# Patient Record
Sex: Male | Born: 1995 | Race: White | Hispanic: No | Marital: Single | Smoking: Former smoker
Health system: Southern US, Community
[De-identification: ages and names within clinical notes are randomized; demographics above are authoritative.]

## PROBLEM LIST (undated history)

## (undated) DIAGNOSIS — R0602 Shortness of breath: Secondary | ICD-10-CM

## (undated) DIAGNOSIS — R059 Cough, unspecified: Secondary | ICD-10-CM

## (undated) DIAGNOSIS — R05 Cough: Secondary | ICD-10-CM

## (undated) DIAGNOSIS — B2790 Infectious mononucleosis, unspecified without complication: Secondary | ICD-10-CM

## (undated) HISTORY — DX: Shortness of breath: R06.02

## (undated) HISTORY — DX: Infectious mononucleosis, unspecified without complication: B27.90

## (undated) HISTORY — DX: Cough: R05

## (undated) HISTORY — DX: Cough, unspecified: R05.9

## (undated) HISTORY — PX: OTHER SURGICAL HISTORY: SHX169

---

## 2018-04-07 ENCOUNTER — Ambulatory Visit
Admission: RE | Admit: 2018-04-07 | Discharge: 2018-04-07 | Disposition: A | Discharge status: Home / Self Care | Payer: Managed Care, Other (non HMO) | Source: Ambulatory Visit | Attending: Family Medicine | Admitting: Family Medicine

## 2018-04-07 ENCOUNTER — Other Ambulatory Visit: Payer: Self-pay | Admitting: Family Medicine

## 2018-04-07 DIAGNOSIS — R0602 Shortness of breath: Secondary | ICD-10-CM | POA: Diagnosis not present

## 2018-04-10 ENCOUNTER — Emergency Department: Payer: Managed Care, Other (non HMO)

## 2018-04-10 ENCOUNTER — Other Ambulatory Visit: Payer: Self-pay

## 2018-04-10 ENCOUNTER — Emergency Department
Admission: EM | Admit: 2018-04-10 | Discharge: 2018-04-10 | Disposition: A | Discharge status: Home / Self Care | Payer: Managed Care, Other (non HMO) | Attending: Emergency Medicine | Admitting: Emergency Medicine

## 2018-04-10 DIAGNOSIS — R091 Pleurisy: Secondary | ICD-10-CM | POA: Insufficient documentation

## 2018-04-10 DIAGNOSIS — R079 Chest pain, unspecified: Secondary | ICD-10-CM | POA: Diagnosis present

## 2018-04-10 LAB — COMPREHENSIVE METABOLIC PANEL
ALK PHOS: 48 U/L (ref 38–126)
ALT: 14 U/L (ref 0–44)
AST: 20 U/L (ref 15–41)
Albumin: 5.3 g/dL — ABNORMAL HIGH (ref 3.5–5.0)
Anion gap: 10 (ref 5–15)
BUN: 18 mg/dL (ref 6–20)
CALCIUM: 9.4 mg/dL (ref 8.9–10.3)
CHLORIDE: 104 mmol/L (ref 98–111)
CO2: 27 mmol/L (ref 22–32)
Creatinine, Ser: 1.04 mg/dL (ref 0.61–1.24)
GFR calc Af Amer: >60 mL/min (ref >60)
GFR calc non Af Amer: >60 mL/min (ref >60)
GLUCOSE: 113 mg/dL — AB (ref 70–99)
Potassium: 3.2 mmol/L — ABNORMAL LOW (ref 3.5–5.1)
Sodium: 141 mmol/L (ref 135–145)
Total Bilirubin: 1.1 mg/dL (ref 0.3–1.2)
Total Protein: 7.8 g/dL (ref 6.5–8.1)

## 2018-04-10 LAB — CBC
HCT: 45.2 % (ref 40.0–52.0)
HEMOGLOBIN: 16.4 g/dL (ref 13.0–18.0)
MCH: 33.8 pg (ref 26.0–34.0)
MCHC: 36.2 g/dL — ABNORMAL HIGH (ref 32.0–36.0)
MCV: 93.5 fL (ref 80.0–100.0)
Platelets: 200 10*3/uL (ref 150–440)
RBC: 4.84 MIL/uL (ref 4.40–5.90)
RDW: 12.7 % (ref 11.5–14.5)
WBC: 8.9 10*3/uL (ref 3.8–10.6)

## 2018-04-10 LAB — TROPONIN I: Troponin I: <0.03 ng/mL (ref <0.03)

## 2018-04-10 MED ORDER — IBUPROFEN 800 MG PO TABS
800.0000 mg | ORAL_TABLET | Freq: Once | ORAL | Status: AC
  Administered 2018-04-10: 800 mg via ORAL
  Filled 2018-04-10: qty 1

## 2018-04-10 NOTE — ED Triage Notes (Signed)
Pt in with co shob and cp x 1 week. States has been feeling fatigued with slight cough.

## 2018-04-11 NOTE — ED Provider Notes (Signed)
[facility] Emergency Department Provider Note   First MD Initiated Contact with Patient 04/10/18 0300     (approximate)  I have reviewed the triage vital signs and the nursing notes.   HISTORY  Chief Complaint Chest Pain    HPI Jeff Mejia is a 22 y.o. male presents with dyspnea and chest pain x 1 week.  Patient states that symptoms are worsened with smoking.  Patient also admits to a nonproductive cough as well.  Patient denies any fever afebrile on presentation temperature 98.4.  Patient denies any lower extremity pain or swelling.  Patient denies any known familial history of CAD or DVT PE.   Past Medical History None There are no active problems to display for this patient.     Prior to Admission medications   Not on File    Allergies No known drug allergies Social History Social History   Tobacco Use  . Smoking status: Not on file  Substance Use Topics  . Alcohol use: Not on file  . Drug use: Not on file    Review of Systems Constitutional: No fever/chills Eyes: No visual changes. ENT: No sore throat. Cardiovascular: Visited for chest pain. Respiratory: Positive for dyspnea and cough. Gastrointestinal: No abdominal pain.  No nausea, no vomiting.  No diarrhea.  No constipation. Genitourinary: Negative for dysuria. Musculoskeletal: Negative for neck pain.  Negative for back pain. Integumentary: Negative for rash. Neurological: Negative for headaches, focal weakness or numbness.   ____________________________________________   PHYSICAL EXAM:  VITAL SIGNS: ED Triage Vitals  Enc Vitals Group     BP 04/10/18 0042 (!) 147/84     Pulse Rate 04/10/18 0042 70     Resp 04/10/18 0042 18     Temp 04/10/18 0042 98.4 F (36.9 C)     Temp Source 04/10/18 0042 Oral     SpO2 04/10/18 0042 100 %     Weight 04/10/18 0043 59 kg (130 lb)     Height 04/10/18 0043 1.803 m (5\' 11" )     Head Circumference --      Peak Flow --    Pain Score 04/10/18 0043 5     Pain Loc --      Pain Edu? --      Excl. in GC? --     Constitutional: Alert and oriented. Well appearing and in no acute distress. Eyes: Conjunctivae are normal.  Head: Atraumatic. Mouth/Throat: Mucous membranes are moist. Oropharynx non-erythematous. Neck: No stridor.   Cardiovascular: Normal rate, regular rhythm. Good peripheral circulation. Grossly normal heart sounds. Respiratory: Normal respiratory effort.  No retractions. Lungs CTAB. Gastrointestinal: Soft and nontender. No distention.  Musculoskeletal: No lower extremity tenderness nor edema. No gross deformities of extremities. Neurologic:  Normal speech and language. No gross focal neurologic deficits are appreciated.  Skin:  Skin is warm, dry and intact. No rash noted. Psychiatric: Mood and affect are normal. Speech and behavior are normal.  ____________________________________________   LABS (all labs ordered are listed, but only abnormal results are displayed)  Labs Reviewed  CBC - Abnormal; Notable for the following components:      Result Value   MCHC 36.2 (*)    All other components within normal limits  COMPREHENSIVE METABOLIC PANEL - Abnormal; Notable for the following components:   Potassium 3.2 (*)    Glucose, Bld 113 (*)    Albumin 5.3 (*)    All other components within normal limits  TROPONIN I   ____________________________________________  EKG  ED ECG REPORT I, [place] N BROWN, the attending physician, personally viewed and interpreted this ECG.   Date: 04/11/2018  EKG Time: 12:46 AM  Rate: 70  Rhythm: Normal sinus rhythm  Axis: Normal  Intervals: Normal  ST&T Change: None  ____________________________________________  RADIOLOGY I, [place] N BROWN, personally viewed and evaluated these images (plain radiographs) as part of my medical decision making, as well as reviewing the written report by the radiologist.  ED MD interpretation: Chest x-ray revealed no  acute findings per radiologist.  Official radiology report(s): No results found.    Procedures   ____________________________________________   INITIAL IMPRESSION / ASSESSMENT AND PLAN / ED COURSE  As part of my medical decision making, I reviewed the following data within the electronic MEDICAL RECORD NUMBER   22 year old male presenting with above-stated history and physical exam of chest pain shortness of breath or cough.  Patient symptoms worsened with smoking.  Suspect possible pleurisy as etiology for the patient's discomfort.  Patient given ibuprofen with improvement of pain in the emergency department.  Laboratory data are normal chest x-ray likewise revealed no acute pathology. ____________________________________________  FINAL CLINICAL IMPRESSION(S) / ED DIAGNOSES  Final diagnoses:  Pleurisy     MEDICATIONS GIVEN DURING THIS VISIT:  Medications  ibuprofen (ADVIL,MOTRIN) tablet 800 mg (800 mg Oral Given 04/10/18 0447)     ED Discharge Orders    None       Note:  This document was prepared using Dragon voice recognition software and may include unintentional dictation errors.    Darci Current, MD 04/11/18 [id number]

## 2018-04-17 ENCOUNTER — Encounter: Payer: Self-pay | Admitting: Internal Medicine

## 2018-04-17 ENCOUNTER — Ambulatory Visit: Payer: Managed Care, Other (non HMO) | Admitting: Internal Medicine

## 2018-04-17 ENCOUNTER — Ambulatory Visit
Admission: RE | Admit: 2018-04-17 | Discharge: 2018-04-17 | Disposition: A | Discharge status: Home / Self Care | Payer: Managed Care, Other (non HMO) | Source: Ambulatory Visit | Attending: Adult Health | Admitting: Adult Health

## 2018-04-17 ENCOUNTER — Ambulatory Visit: Payer: Self-pay | Admitting: Internal Medicine

## 2018-04-17 VITALS — BP 122/76 | HR 76 | Resp 16 | Ht 71.0 in | Wt 133.0 lb

## 2018-04-17 DIAGNOSIS — R079 Chest pain, unspecified: Secondary | ICD-10-CM

## 2018-04-17 DIAGNOSIS — R05 Cough: Secondary | ICD-10-CM | POA: Diagnosis not present

## 2018-04-17 DIAGNOSIS — Z23 Encounter for immunization: Secondary | ICD-10-CM | POA: Diagnosis not present

## 2018-04-17 DIAGNOSIS — R0602 Shortness of breath: Secondary | ICD-10-CM

## 2018-04-17 DIAGNOSIS — R059 Cough, unspecified: Secondary | ICD-10-CM

## 2018-04-17 MED ORDER — IOHEXOL 350 MG/ML SOLN
75.0000 mL | Freq: Once | INTRAVENOUS | Status: AC | PRN
  Administered 2018-04-17: 75 mL via INTRAVENOUS

## 2018-04-17 NOTE — Progress Notes (Signed)
[facility] [place], [place] [address]  Pulmonary Sleep Medicine   Office Visit Note  Patient Name: Jeff Mejia DOB: 1996/06/27 MRN [id number]  Date of Service: 04/17/2018  Complaints/HPI: SOB going on for about 2 weeks. He states never had breathing issues in the past. He does smoke and states he was using E cigarette. Patient also does smoke marijuana. He since his breathing has been a problem states he stopped smoking the cigarettes. He has noted some improvement but not at baseline. He states no prior history of asthma. NO family history of asthma. He was on ibuprofen for "pleurisy". He states that he feels no real change. 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 Patient states he never had these issues when he was playing. Patient states he is not coughing up any sputum. He feels some phlegmn in his throat but nothing coming up. Patient states no headaches no dizziness. He does have some feeling of weakness. He is not sure if anything else was in the marijuana. He states that he does drink. No hemoptysis noted he does live in [place] and travels back and forth. Last trip was in August. Flies or drives.   ROS  General: (-) fever, (-) chills, (-) night sweats, (-) weakness Skin: (-) rashes, (-) itching,. Eyes: (-) visual changes, (-) redness, (-) itching. Nose and Sinuses: (-) nasal stuffiness or itchiness, (-) postnasal drip, (-) nosebleeds, (-) sinus trouble. Mouth and Throat: (-) sore throat, (-) hoarseness. Neck: (-) swollen glands, (-) enlarged thyroid, (-) neck pain. Respiratory: + cough, (-) bloody sputum, + shortness of breath, + wheezing. Cardiovascular: - ankle swelling, (-) chest pain. Lymphatic: (-) lymph node enlargement. Neurologic: (-) numbness, (-) tingling. Psychiatric: (-) anxiety, (-) depression   Current Medication: Outpatient Encounter Medications as of 04/17/2018  Medication Sig  . Adapalene-Benzoyl  Peroxide (EPIDUO) 0.1-2.5 % gel Apply topically at bedtime.  Marland Kitchen albuterol (PROVENTIL HFA;VENTOLIN HFA) 108 (90 Base) MCG/ACT inhaler Inhale 2 puffs into the lungs every 6 (six) hours as needed for wheezing or shortness of breath. 2 puffs every 4 to 6 hrs  Prn  . ibuprofen (ADVIL,MOTRIN) 600 MG tablet Take 600 mg by mouth every 6 (six) hours as needed.   No facility-administered encounter medications on file as of 04/17/2018.     Surgical History: Past Surgical History:  Procedure Laterality Date  . wisdom      Medical History: Past Medical History:  Diagnosis Date  . SOB (shortness of breath)     Family History: Family History  Problem Relation Age of Onset  . Skin cancer Mother   . Lung cancer Maternal Grandmother     Social History: Social History   Socioeconomic History  . Marital status: Single    Spouse name: Not on file  . Number of children: Not on file  . Years of education: Not on file  . Highest education level: Not on file  Occupational History  . Not on file  Social Needs  . Financial resource strain: Not on file  . Food insecurity:    Worry: Not on file    Inability: Not on file  . Transportation needs:    Medical: Not on file    Non-medical: Not on file  Tobacco Use  . Smoking status: Current Every Day Smoker    Types: E-cigarettes  . Smokeless tobacco: Current User    Types: Chew  Substance and Sexual Activity  . Alcohol use: Yes  .  Drug use: Yes    Types: Marijuana  . Sexual activity: Not on file  Lifestyle  . Physical activity:    Days per week: Not on file    Minutes per session: Not on file  . Stress: Not on file  Relationships  . Social connections:    Talks on phone: Not on file    Gets together: Not on file    Attends religious service: Not on file    Active member of club or organization: Not on file    Attends meetings of clubs or organizations: Not on file    Relationship status: Not on file  . Intimate partner violence:     Fear of current or ex partner: Not on file    Emotionally abused: Not on file    Physically abused: Not on file    Forced sexual activity: Not on file  Other Topics Concern  . Not on file  Social History Narrative  . Not on file    Vital Signs: Blood pressure 122/76, pulse 76, resp. rate 16, height '5\' 11"'  (1.803 m), weight 133 lb (60.3 kg), SpO2 98 %.  Examination: General Appearance: The patient is well-developed, well-nourished, and in no distress. Skin: Gross inspection of skin unremarkable. Head: normocephalic, no gross deformities. Eyes: no gross deformities noted. ENT: ears appear grossly normal no exudates. Neck: Supple. No thyromegaly. No LAD. Respiratory: scattered rhonchi noted at this time. Cardiovascular: Normal S1 and S2 without murmur or rub. Extremities: No cyanosis. pulses are equal. Neurologic: Alert and oriented. No involuntary movements.  LABS: Recent Results (from the past 2160 hour(s))  CBC     Status: Abnormal   Collection Time: 04/10/18 12:45 AM  Result Value Ref Range   WBC 8.9 3.8 - 10.6 K/uL   RBC 4.84 4.40 - 5.90 MIL/uL   Hemoglobin 16.4 13.0 - 18.0 g/dL    Comment: RESULT REPEATED AND VERIFIED   HCT 45.2 40.0 - 52.0 %    Comment: RESULT REPEATED AND VERIFIED   MCV 93.5 80.0 - 100.0 fL   MCH 33.8 26.0 - 34.0 pg   MCHC 36.2 (H) 32.0 - 36.0 g/dL   RDW 12.7 11.5 - 14.5 %   Platelets 200 150 - 440 K/uL    Comment: Performed at [facility], [address] [place], [place] [address]  Comprehensive metabolic panel     Status: Abnormal   Collection Time: 04/10/18 12:45 AM  Result Value Ref Range   Sodium 141 135 - 145 mmol/L   Potassium 3.2 (L) 3.5 - 5.1 mmol/L   Chloride 104 98 - 111 mmol/L   CO2 27 22 - 32 mmol/L   Glucose, Bld 113 (H) 70 - 99 mg/dL   BUN 18 6 - 20 mg/dL   Creatinine, Ser 1.04 0.61 - 1.24 mg/dL   Calcium 9.4 8.9 - 10.3 mg/dL   Total Protein 7.8 6.5 - 8.1 g/dL   Albumin 5.3 (H) 3.5 - 5.0 g/dL   AST 20 15 - 41 U/L    ALT 14 0 - 44 U/L   Alkaline Phosphatase 48 38 - 126 U/L   Total Bilirubin 1.1 0.3 - 1.2 mg/dL   GFR calc non Af Amer >60 >60 mL/min   GFR calc Af Amer >60 >60 mL/min    Comment: (NOTE) The eGFR has been calculated using the CKD EPI equation. This calculation has not been validated in all clinical situations. eGFR's persistently <60 mL/min signify possible Chronic Kidney Disease.  Anion gap 10 5 - 15    Comment: Performed at [facility], [address] [place], [place] [address]  Troponin I     Status: None   Collection Time: 04/10/18 12:45 AM  Result Value Ref Range   Troponin I <0.03 <0.03 ng/mL    Comment: Performed at [facility], [address] [place], [place] [address]    Radiology: Dg Chest 2 View  Result Date: 04/10/2018 CLINICAL DATA:  Chest pain EXAM: CHEST - 2 VIEW COMPARISON:  04/07/2018 chest radiograph. FINDINGS: Stable cardiomediastinal silhouette with normal heart size. No pneumothorax. No pleural effusion. Lungs appear clear, with no acute consolidative airspace disease and no pulmonary edema. IMPRESSION: No active cardiopulmonary disease. Electronically Signed   By: Ilona Sorrel M.D.   On: 04/10/2018 01:33    No results found.  Dg Chest 2 View  Result Date: 04/10/2018 CLINICAL DATA:  Chest pain EXAM: CHEST - 2 VIEW COMPARISON:  04/07/2018 chest radiograph. FINDINGS: Stable cardiomediastinal silhouette with normal heart size. No pneumothorax. No pleural effusion. Lungs appear clear, with no acute consolidative airspace disease and no pulmonary edema. IMPRESSION: No active cardiopulmonary disease. Electronically Signed   By: Ilona Sorrel M.D.   On: 04/10/2018 01:33   Dg Chest 2 View  Result Date: 04/08/2018 CLINICAL DATA:  Shortness of breath. EXAM: CHEST - 2 VIEW COMPARISON:  No prior. FINDINGS: Mediastinum hilar structures normal. Lungs are clear. No pleural effusion or pneumothorax. Heart size normal. Thoracic spine scoliosis and  degenerative change. No acute bony abnormality. Thoracic spine scoliosis. IMPRESSION: No acute cardiopulmonary disease. Electronically Signed   By: Marcello Moores  Register   On: 04/08/2018 07:28      Assessment and Plan: Patient Active Problem List   Diagnosis Date Noted  . SOB (shortness of breath)   . Cough     1. Shortness of breath patient has a strong history of e-cigarette use of my concern is that he may be developing a reaction to the electronic cigarette.  The chest x-ray did not show any acute pulmonary disease but he still has cough and some shortness of breath.  I recommended that he get a pulmonary function study done in addition a CT angiogram was ordered to get a better look at his pulmonary parenchyma make sure that he does not have any interstitial lung disease. 2. For the chest pain patient is going to get an EKG done we will review this 3. Cough may be related to acute bronchitis as above patient is going to stop all use of electronic cigarettes and will follow him up to make sure that he is showing improvement once the cigarettes have been stopped. 4. Flu vaccination was given today  General Counseling: I have discussed the findings of the evaluation and examination with Rodman Key.  I have also discussed any further diagnostic evaluation thatmay be needed or ordered today. Herman verbalizes understanding of the findings of todays visit. We also reviewed his medications today and discussed drug interactions and side effects including but not limited excessive drowsiness and altered mental states. We also discussed that there is always a risk not just to him but also people around him. he has been encouraged to call the office with any questions or concerns that should arise related to todays visit.    Time spent: 77mn  I have personally obtained a history, examined the patient, evaluated laboratory and imaging results, formulated the assessment and plan and placed orders.     Adden Strout A  Humphrey Rolls, MD [facility] Pulmonary and Critical Care Sleep medicine

## 2018-04-22 NOTE — Progress Notes (Signed)
SCANNED IN NEW PT PAPERWORK SIGNED ON 04/17/18.

## 2018-04-27 ENCOUNTER — Encounter: Payer: Self-pay | Admitting: Internal Medicine

## 2018-04-27 NOTE — Patient Instructions (Signed)
Coping with Quitting Smoking Quitting smoking is a physical and mental challenge. You will face cravings, withdrawal symptoms, and temptation. Before quitting, work with your health care provider to make a plan that can help you cope. Preparation can help you quit and keep you from giving in. How can I cope with cravings? Cravings usually last for 5-10 minutes. If you get through it, the craving will pass. Consider taking the following actions to help you cope with cravings:  Keep your mouth busy: ? Chew sugar-free gum. ? Suck on hard candies or a straw. ? Brush your teeth.  Keep your hands and body busy: ? Immediately change to a different activity when you feel a craving. ? Squeeze or play with a ball. ? Do an activity or a hobby, like making bead jewelry, practicing needlepoint, or working with wood. ? Mix up your normal routine. ? Take a short exercise break. Go for a quick walk or run up and down stairs. ? Spend time in public places where smoking is not allowed.  Focus on doing something kind or helpful for someone else.  Call a friend or family member to talk during a craving.  Join a support group.  Call a quit line, such as 1-800-QUIT-NOW.  Talk with your health care provider about medicines that might help you cope with cravings and make quitting easier for you.  How can I deal with withdrawal symptoms? Your body may experience negative effects as it tries to get used to not having nicotine in the system. These effects are called withdrawal symptoms. They may include:  Feeling hungrier than normal.  Trouble concentrating.  Irritability.  Trouble sleeping.  Feeling depressed.  Restlessness and agitation.  Craving a cigarette.  To manage withdrawal symptoms:  Avoid places, people, and activities that trigger your cravings.  Remember why you want to quit.  Get plenty of sleep.  Avoid coffee and other caffeinated drinks. These may worsen some of your  symptoms.  How can I handle social situations? Social situations can be difficult when you are quitting smoking, especially in the first few weeks. To manage this, you can:  Avoid parties, bars, and other social situations where people might be smoking.  Avoid alcohol.  Leave right away if you have the urge to smoke.  Explain to your family and friends that you are quitting smoking. Ask for understanding and support.  Plan activities with friends or family where smoking is not an option.  What are some ways I can cope with stress? Wanting to smoke may cause stress, and stress can make you want to smoke. Find ways to manage your stress. Relaxation techniques can help. For example:  Breathe slowly and deeply, in through your nose and out through your mouth.  Listen to soothing, relaxing music.  Talk with a family member or friend about your stress.  Light a candle.  Soak in a bath or take a shower.  Think about a peaceful place.  What are some ways I can prevent weight gain? Be aware that many people gain weight after they quit smoking. However, not everyone does. To keep from gaining weight, have a plan in place before you quit and stick to the plan after you quit. Your plan should include:  Having healthy snacks. When you have a craving, it may help to: ? Eat plain popcorn, crunchy carrots, celery, or other cut vegetables. ? Chew sugar-free gum.  Changing how you eat: ? Eat small portion sizes at meals. ?   Eat 4-6 small meals throughout the day instead of 1-2 large meals a day. ? Be mindful when you eat. Do not watch television or do other things that might distract you as you eat.  Exercising regularly: ? Make time to exercise each day. If you do not have time for a long workout, do short bouts of exercise for 5-10 minutes several times a day. ? Do some form of strengthening exercise, like weight lifting, and some form of aerobic exercise, like running or  swimming.  Drinking plenty of water or other low-calorie or no-calorie drinks. Drink 6-8 glasses of water daily, or as much as instructed by your health care provider.  Summary  Quitting smoking is a physical and mental challenge. You will face cravings, withdrawal symptoms, and temptation to smoke again. Preparation can help you as you go through these challenges.  You can cope with cravings by keeping your mouth busy (such as by chewing gum), keeping your body and hands busy, and making calls to family, friends, or a helpline for people who want to quit smoking.  You can cope with withdrawal symptoms by avoiding places where people smoke, avoiding drinks with caffeine, and getting plenty of rest.  Ask your health care provider about the different ways to prevent weight gain, avoid stress, and handle social situations. This information is not intended to replace advice given to you by your health care provider. Make sure you discuss any questions you have with your health care provider. Document Released: 06/29/2016 Document Revised: 06/29/2016 Document Reviewed: 06/29/2016 [employer] Interactive Patient Education  2018 [employer]  

## 2018-04-28 ENCOUNTER — Ambulatory Visit: Payer: Self-pay | Admitting: Internal Medicine

## 2018-04-30 ENCOUNTER — Ambulatory Visit: Payer: Managed Care, Other (non HMO) | Admitting: Internal Medicine

## 2018-04-30 DIAGNOSIS — R0602 Shortness of breath: Secondary | ICD-10-CM

## 2018-04-30 LAB — PULMONARY FUNCTION TEST

## 2018-05-01 ENCOUNTER — Ambulatory Visit: Payer: Self-pay | Admitting: Internal Medicine

## 2018-05-01 ENCOUNTER — Encounter: Payer: Self-pay | Admitting: Internal Medicine

## 2018-05-01 ENCOUNTER — Ambulatory Visit: Payer: Managed Care, Other (non HMO) | Admitting: Internal Medicine

## 2018-05-01 VITALS — BP 122/72 | HR 79 | Resp 16 | Ht 71.0 in | Wt 134.0 lb

## 2018-05-01 DIAGNOSIS — J40 Bronchitis, not specified as acute or chronic: Secondary | ICD-10-CM

## 2018-05-01 DIAGNOSIS — K219 Gastro-esophageal reflux disease without esophagitis: Secondary | ICD-10-CM

## 2018-05-01 DIAGNOSIS — R0602 Shortness of breath: Secondary | ICD-10-CM

## 2018-05-01 DIAGNOSIS — R05 Cough: Secondary | ICD-10-CM

## 2018-05-01 DIAGNOSIS — R059 Cough, unspecified: Secondary | ICD-10-CM

## 2018-05-01 NOTE — Progress Notes (Signed)
[facility] [place], [place] [address]  Pulmonary Sleep Medicine   Office Visit Note  Patient Name: Jeff Mejia DOB: 08/28/1995 MRN [id number]  Date of Service: 05/01/2018  Complaints/HPI: Patient is doing better he is here for follow-up.  States he is no longer smoking and is no longer using the vapor electronic cigarettes.  States he has still a little bit of cough on occasion and feels some shortness of breath.  He has noted palpitations also.  I told him that it could be related to the albuterol or the [facility].  He had pulmonary functions done which did show significant improvement compared to the spirometry he had done at the last visit.  He has had improvement from 2.8 L almost 4.6 L.  The patient I think likely had bronchospasm related to the electronic cigarettes.  The other issue he has he is having anxiety type attacks where he feels panicky.  The patient states that he does get palpitations also.  He has tried to limit all his caffeine.  ROS  General: (-) fever, (-) chills, (-) night sweats, (-) weakness Skin: (-) rashes, (-) itching,. Eyes: (-) visual changes, (-) redness, (-) itching. Nose and Sinuses: (-) nasal stuffiness or itchiness, (-) postnasal drip, (-) nosebleeds, (-) sinus trouble. Mouth and Throat: (-) sore throat, (-) hoarseness. Neck: (-) swollen glands, (-) enlarged thyroid, (-) neck pain. Respiratory: - cough, (-) bloody sputum, + shortness of breath, - wheezing. Cardiovascular: - ankle swelling, (-) chest pain. Lymphatic: (-) lymph node enlargement. Neurologic: (-) numbness, (-) tingling. Psychiatric: (-) anxiety, (-) depression   Current Medication: Outpatient Encounter Medications as of 05/01/2018  Medication Sig  . Adapalene-Benzoyl Peroxide (EPIDUO) 0.1-2.5 % gel Apply topically at bedtime.  Marland Kitchen albuterol (PROVENTIL HFA;VENTOLIN HFA) 108 (90 Base) MCG/ACT inhaler Inhale 2 puffs into the lungs every 6 (six) hours as needed  for wheezing or shortness of breath. 2 puffs every 4 to 6 hrs  Prn  . ibuprofen (ADVIL,MOTRIN) 600 MG tablet Take 600 mg by mouth every 6 (six) hours as needed.   No facility-administered encounter medications on file as of 05/01/2018.     Surgical History: Past Surgical History:  Procedure Laterality Date  . wisdom      Medical History: Past Medical History:  Diagnosis Date  . Cough   . SOB (shortness of breath)   . SOB (shortness of breath)     Family History: Family History  Problem Relation Age of Onset  . Skin cancer Mother   . Lung cancer Maternal Grandmother     Social History: Social History   Socioeconomic History  . Marital status: Single    Spouse name: Not on file  . Number of children: Not on file  . Years of education: Not on file  . Highest education level: Not on file  Occupational History  . Not on file  Social Needs  . Financial resource strain: Not on file  . Food insecurity:    Worry: Not on file    Inability: Not on file  . Transportation needs:    Medical: Not on file    Non-medical: Not on file  Tobacco Use  . Smoking status: Former Smoker    Types: E-cigarettes  . Smokeless tobacco: Current User    Types: Chew  Substance and Sexual Activity  . Alcohol use: Yes  . Drug use: Yes    Types: Marijuana  . Sexual activity: Not on file  Lifestyle  .  Physical activity:    Days per week: Not on file    Minutes per session: Not on file  . Stress: Not on file  Relationships  . Social connections:    Talks on phone: Not on file    Gets together: Not on file    Attends religious service: Not on file    Active member of club or organization: Not on file    Attends meetings of clubs or organizations: Not on file    Relationship status: Not on file  . Intimate partner violence:    Fear of current or ex partner: Not on file    Emotionally abused: Not on file    Physically abused: Not on file    Forced sexual activity: Not on file  Other  Topics Concern  . Not on file  Social History Narrative  . Not on file    Vital Signs: Blood pressure 122/72, pulse 79, resp. rate 16, height '5\' 11"'  (1.803 m), weight 134 lb (60.8 kg), SpO2 99 %.  Examination: General Appearance: The patient is well-developed, well-nourished, and in no distress. Skin: Gross inspection of skin unremarkable. Head: normocephalic, no gross deformities. Eyes: no gross deformities noted. ENT: ears appear grossly normal no exudates. Neck: Supple. No thyromegaly. No LAD. Respiratory: no rhonchi. Cardiovascular: Normal S1 and S2 without murmur or rub. Extremities: No cyanosis. pulses are equal. Neurologic: Alert and oriented. No involuntary movements.  LABS: Recent Results (from the past 2160 hour(s))  CBC     Status: Abnormal   Collection Time: 04/10/18 12:45 AM  Result Value Ref Range   WBC 8.9 3.8 - 10.6 K/uL   RBC 4.84 4.40 - 5.90 MIL/uL   Hemoglobin 16.4 13.0 - 18.0 g/dL    Comment: RESULT REPEATED AND VERIFIED   HCT 45.2 40.0 - 52.0 %    Comment: RESULT REPEATED AND VERIFIED   MCV 93.5 80.0 - 100.0 fL   MCH 33.8 26.0 - 34.0 pg   MCHC 36.2 (H) 32.0 - 36.0 g/dL   RDW 12.7 11.5 - 14.5 %   Platelets 200 150 - 440 K/uL    Comment: Performed at [facility], [address] [place], [place] [address]  Comprehensive metabolic panel     Status: Abnormal   Collection Time: 04/10/18 12:45 AM  Result Value Ref Range   Sodium 141 135 - 145 mmol/L   Potassium 3.2 (L) 3.5 - 5.1 mmol/L   Chloride 104 98 - 111 mmol/L   CO2 27 22 - 32 mmol/L   Glucose, Bld 113 (H) 70 - 99 mg/dL   BUN 18 6 - 20 mg/dL   Creatinine, Ser 1.04 0.61 - 1.24 mg/dL   Calcium 9.4 8.9 - 10.3 mg/dL   Total Protein 7.8 6.5 - 8.1 g/dL   Albumin 5.3 (H) 3.5 - 5.0 g/dL   AST 20 15 - 41 U/L   ALT 14 0 - 44 U/L   Alkaline Phosphatase 48 38 - 126 U/L   Total Bilirubin 1.1 0.3 - 1.2 mg/dL   GFR calc non Af Amer >60 >60 mL/min   GFR calc Af Amer >60 >60 mL/min    Comment:  (NOTE) The eGFR has been calculated using the CKD EPI equation. This calculation has not been validated in all clinical situations. eGFR's persistently <60 mL/min signify possible Chronic Kidney Disease.    Anion gap 10 5 - 15    Comment: Performed at [facility], [address] [place], [place] [address]  Troponin I     Status: None   Collection Time: 04/10/18 12:45 AM  Result Value Ref Range   Troponin I <0.03 <0.03 ng/mL    Comment: Performed at [facility], [address] [place], [place] [address]    Radiology: Ct Angio Chest W/cm &/or Wo Cm  Result Date: 04/17/2018 CLINICAL DATA:  Chest pain and shortness of breath EXAM: CT ANGIOGRAPHY CHEST WITH CONTRAST TECHNIQUE: Multidetector CT imaging of the chest was performed using the standard protocol during bolus administration of intravenous contrast. Multiplanar CT image reconstructions and MIPs were obtained to evaluate the vascular anatomy. CONTRAST:  29m OMNIPAQUE IOHEXOL 350 MG/ML SOLN COMPARISON:  Chest radiograph April 10, 2018 FINDINGS: Cardiovascular: There is no demonstrable pulmonary embolus. There is no thoracic aortic aneurysm or dissection. The visualized great vessels appear unremarkable. There is no pericardial effusion or pericardial thickening. Mediastinum/Nodes: Thyroid appears normal. There is no appreciable thoracic adenopathy. No esophageal lesions are appreciable. Lungs/Pleura: Lungs are clear. There is no edema or consolidation. No pleural effusion or pleural thickening evident. Upper Abdomen: Visualized upper abdominal structures appear normal. Musculoskeletal: There are no blastic or lytic bone lesions. No chest wall lesions evident. Review of the MIP images confirms the above findings. IMPRESSION: 1. No demonstrable pulmonary embolus. No thoracic aortic aneurysm or dissection. 2.  Lungs clear. 3.  No evident thoracic adenopathy. Electronically Signed   By: WLowella GripIII M.D.   On:  04/17/2018 13:05    No results found.  Dg Chest 2 View  Result Date: 04/10/2018 CLINICAL DATA:  Chest pain EXAM: CHEST - 2 VIEW COMPARISON:  04/07/2018 chest radiograph. FINDINGS: Stable cardiomediastinal silhouette with normal heart size. No pneumothorax. No pleural effusion. Lungs appear clear, with no acute consolidative airspace disease and no pulmonary edema. IMPRESSION: No active cardiopulmonary disease. Electronically Signed   By: JIlona SorrelM.D.   On: 04/10/2018 01:33   Dg Chest 2 View  Result Date: 04/08/2018 CLINICAL DATA:  Shortness of breath. EXAM: CHEST - 2 VIEW COMPARISON:  No prior. FINDINGS: Mediastinum hilar structures normal. Lungs are clear. No pleural effusion or pneumothorax. Heart size normal. Thoracic spine scoliosis and degenerative change. No acute bony abnormality. Thoracic spine scoliosis. IMPRESSION: No acute cardiopulmonary disease. Electronically Signed   By: TMarcello Moores Register   On: 04/08/2018 07:28   Ct Angio Chest W/cm &/or Wo Cm  Result Date: 04/17/2018 CLINICAL DATA:  Chest pain and shortness of breath EXAM: CT ANGIOGRAPHY CHEST WITH CONTRAST TECHNIQUE: Multidetector CT imaging of the chest was performed using the standard protocol during bolus administration of intravenous contrast. Multiplanar CT image reconstructions and MIPs were obtained to evaluate the vascular anatomy. CONTRAST:  743mOMNIPAQUE IOHEXOL 350 MG/ML SOLN COMPARISON:  Chest radiograph April 10, 2018 FINDINGS: Cardiovascular: There is no demonstrable pulmonary embolus. There is no thoracic aortic aneurysm or dissection. The visualized great vessels appear unremarkable. There is no pericardial effusion or pericardial thickening. Mediastinum/Nodes: Thyroid appears normal. There is no appreciable thoracic adenopathy. No esophageal lesions are appreciable. Lungs/Pleura: Lungs are clear. There is no edema or consolidation. No pleural effusion or pleural thickening evident. Upper Abdomen: Visualized  upper abdominal structures appear normal. Musculoskeletal: There are no blastic or lytic bone lesions. No chest wall lesions evident. Review of the MIP images confirms the above findings. IMPRESSION: 1. No demonstrable pulmonary embolus. No thoracic aortic aneurysm or dissection. 2.  Lungs clear. 3.  No evident thoracic adenopathy. Electronically Signed   By: WiLowella GripII M.D.  On: 04/17/2018 13:05      Assessment and Plan: Patient Active Problem List   Diagnosis Date Noted  . SOB (shortness of breath)   . Cough     1. SOB he is improving still has some residual shortness of breath.  He is going to continue with the albuterol as needed on when I have him come back and eventually taper off the Breo.  The albuterol should be used only as needed.  In addition getting get an echocardiogram for completeness because of the persistence of his shortness of breath and these panic-like attacks.  Need to make certain that there is no underlying valvular heart disease which could be causing his palpitations. 2. Cough clinically improving no longer smoking he understands that the electronic cigarettes are dangerous and he is not going to use anything going forwards. 3. Bronchitis improved we will continue with supportive care 4. GERD he started on Prilosec after he spoke to 1 of his family members.  I suggested that he can continue using antiacid medications and recommended over-the-counter Zantac.  General Counseling: I have discussed the findings of the evaluation and examination with Rodman Key.  I have also discussed any further diagnostic evaluation thatmay be needed or ordered today. Ismaeel verbalizes understanding of the findings of todays visit. We also reviewed his medications today and discussed drug interactions and side effects including but not limited excessive drowsiness and altered mental states. We also discussed that there is always a risk not just to him but also people around him. he  has been encouraged to call the office with any questions or concerns that should arise related to todays visit.    Time spent: 73mn  I have personally obtained a history, examined the patient, evaluated laboratory and imaging results, formulated the assessment and plan and placed orders.    SAllyne Gee MD [facility] HospitalP[facility] Sleep medicine

## 2018-05-01 NOTE — Patient Instructions (Signed)
Echocardiogram An echocardiogram, or echocardiography, uses sound waves (ultrasound) to produce an image of your heart. The echocardiogram is simple, painless, obtained within a short period of time, and offers valuable information to your health care provider. The images from an echocardiogram can provide information such as:  Evidence of coronary artery disease (CAD).  Heart size.  Heart muscle function.  Heart valve function.  Aneurysm detection.  Evidence of a past heart attack.  Fluid buildup around the heart.  Heart muscle thickening.  Assess heart valve function.  Tell a health care provider about:  Any allergies you have.  All medicines you are taking, including vitamins, herbs, eye drops, creams, and over-the-counter medicines.  Any problems you or family members have had with anesthetic medicines.  Any blood disorders you have.  Any surgeries you have had.  Any medical conditions you have.  Whether you are pregnant or may be pregnant. What happens before the procedure? No special preparation is needed. Eat and drink normally. What happens during the procedure?  In order to produce an image of your heart, gel will be applied to your chest and a wand-like tool (transducer) will be moved over your chest. The gel will help transmit the sound waves from the transducer. The sound waves will harmlessly bounce off your heart to allow the heart images to be captured in real-time motion. These images will then be recorded.  You may need an IV to receive a medicine that improves the quality of the pictures. What happens after the procedure? You may return to your normal schedule including diet, activities, and medicines, unless your health care provider tells you otherwise. This information is not intended to replace advice given to you by your health care provider. Make sure you discuss any questions you have with your health care provider. Document Released: 06/29/2000  Document Revised: 02/18/2016 Document Reviewed: 03/09/2013 Elsevier Interactive Patient Education  2017 [employer]  

## 2018-05-01 NOTE — Procedures (Signed)
[facility] [address] [place], [address]  DATE OF SERVICE: April 30, 2018  Complete Pulmonary Function Testing Interpretation:  FINDINGS:  The forced vital capacity is normal the FEV1 is 4.63 L which is within normal limits.  The FEV1 FVC ratio is normal.  Postbronchodilator there is no significant change in the FEV1.  Total lung capacity is normal residual volume is increased residual volume total lung capacity ratio is increased the FRC is increased.  The DLCO is increased.  IMPRESSION:  This pulmonary function study is within normal limits.  The patient's spirometry from the last visit was reviewed and there has been a significant improvement in his spirometry compared to the initial visit.  Yevonne Pax, MD [facility] Pulmonary Critical Care Medicine Sleep Medicine

## 2018-05-05 ENCOUNTER — Telehealth: Payer: Self-pay

## 2018-05-05 ENCOUNTER — Ambulatory Visit: Payer: Self-pay | Admitting: Internal Medicine

## 2018-05-05 NOTE — Telephone Encounter (Signed)
Pt advised take OTC Pepcid or Famotidine

## 2018-05-05 NOTE — Telephone Encounter (Signed)
Pt can use famotidine, or pepcid OTC.

## 2018-05-28 ENCOUNTER — Other Ambulatory Visit: Payer: Self-pay

## 2018-05-28 MED ORDER — ALBUTEROL SULFATE HFA 108 (90 BASE) MCG/ACT IN AERS: 2.0000 | INHALATION_SPRAY | Freq: Four times a day (QID) | RESPIRATORY_TRACT | 3 refills | Status: AC | PRN

## 2018-05-30 ENCOUNTER — Ambulatory Visit: Payer: Managed Care, Other (non HMO)

## 2018-05-30 DIAGNOSIS — R0602 Shortness of breath: Secondary | ICD-10-CM | POA: Diagnosis not present

## 2018-06-05 ENCOUNTER — Ambulatory Visit: Payer: Managed Care, Other (non HMO) | Admitting: Internal Medicine

## 2018-06-05 ENCOUNTER — Encounter: Payer: Self-pay | Admitting: Internal Medicine

## 2018-06-05 VITALS — BP 112/58 | HR 89 | Resp 16 | Ht 71.0 in | Wt 142.0 lb

## 2018-06-05 DIAGNOSIS — R059 Cough, unspecified: Secondary | ICD-10-CM

## 2018-06-05 DIAGNOSIS — R079 Chest pain, unspecified: Secondary | ICD-10-CM | POA: Diagnosis not present

## 2018-06-05 DIAGNOSIS — R0602 Shortness of breath: Secondary | ICD-10-CM | POA: Diagnosis not present

## 2018-06-05 DIAGNOSIS — R05 Cough: Secondary | ICD-10-CM | POA: Diagnosis not present

## 2018-06-05 NOTE — Progress Notes (Signed)
[facility] [place], [place] [address]  Pulmonary Sleep Medicine   Office Visit Note  Patient Name: Jeff Mejia DOB: 1996-06-21 MRN [id number]  Date of Service: 06/05/2018  Complaints/HPI: Pt here for follow up on Echo results.  His echo showed normal LVEF as well as normal diastolic function.  Patient was relieved to have normal echo results.  He states that overall he has been doing well he has continue to use his albuterol inhaler once a day.  We discussed that his inhaler should only used as needed and that excessive amounts of albuterol possibly cause tachycardia and a jittery anxious feeling.  Patient reports he still feels short of breath at times however he describes it more as a panic or anxiety feeling at this point.  He continues to report that he has stopped smoking cigarettes or vaping.  He does however report that he continues to smoke marijuana intermittently.  He reports after smoking marijuana his breathing and chest tightness feels better than it ever does.  ROS  General: (-) fever, (-) chills, (-) night sweats, (-) weakness Skin: (-) rashes, (-) itching,. Eyes: (-) visual changes, (-) redness, (-) itching. Nose and Sinuses: (-) nasal stuffiness or itchiness, (-) postnasal drip, (-) nosebleeds, (-) sinus trouble. Mouth and Throat: (-) sore throat, (-) hoarseness. Neck: (-) swollen glands, (-) enlarged thyroid, (-) neck pain. Respiratory: - cough, (-) bloody sputum, - shortness of breath, - wheezing. Cardiovascular: - ankle swelling, (-) chest pain. Lymphatic: (-) lymph node enlargement. Neurologic: (-) numbness, (-) tingling. Psychiatric: (-) anxiety, (-) depression   Current Medication: Outpatient Encounter Medications as of 06/05/2018  Medication Sig  . Adapalene-Benzoyl Peroxide (EPIDUO) 0.1-2.5 % gel Apply topically at bedtime.  Marland Kitchen albuterol (PROVENTIL HFA;VENTOLIN HFA) 108 (90 Base) MCG/ACT inhaler Inhale 2 puffs into the lungs  every 6 (six) hours as needed for wheezing or shortness of breath. 2 puffs every 4 to 6 hrs  Prn  . ibuprofen (ADVIL,MOTRIN) 600 MG tablet Take 600 mg by mouth every 6 (six) hours as needed.   No facility-administered encounter medications on file as of 06/05/2018.     Surgical History: Past Surgical History:  Procedure Laterality Date  . wisdom      Medical History: Past Medical History:  Diagnosis Date  . Cough   . Mononucleosis   . SOB (shortness of breath)   . SOB (shortness of breath)     Family History: Family History  Problem Relation Age of Onset  . Skin cancer Mother   . Lung cancer Maternal Grandmother     Social History: Social History   Socioeconomic History  . Marital status: Single    Spouse name: Not on file  . Number of children: Not on file  . Years of education: Not on file  . Highest education level: Not on file  Occupational History  . Not on file  Social Needs  . Financial resource strain: Not on file  . Food insecurity:    Worry: Not on file    Inability: Not on file  . Transportation needs:    Medical: Not on file    Non-medical: Not on file  Tobacco Use  . Smoking status: Former Smoker    Types: E-cigarettes  . Smokeless tobacco: Former Systems developer    Types: Chew  Substance and Sexual Activity  . Alcohol use: Yes  . Drug use: Yes    Types: Marijuana  . Sexual activity: Not on file  Lifestyle  .  Physical activity:    Days per week: Not on file    Minutes per session: Not on file  . Stress: Not on file  Relationships  . Social connections:    Talks on phone: Not on file    Gets together: Not on file    Attends religious service: Not on file    Active member of club or organization: Not on file    Attends meetings of clubs or organizations: Not on file    Relationship status: Not on file  . Intimate partner violence:    Fear of current or ex partner: Not on file    Emotionally abused: Not on file    Physically abused: Not on file     Forced sexual activity: Not on file  Other Topics Concern  . Not on file  Social History Narrative  . Not on file    Vital Signs: Blood pressure (!) 112/58, pulse 89, resp. rate 16, height _0  (1.803 m), weight 142 lb (64.4 kg), SpO2 99 %.  Examination: General Appearance: The patient is well-developed, well-nourished, and in no distress. Skin: Gross inspection of skin unremarkable. Head: normocephalic, no gross deformities. Eyes: no gross deformities noted. ENT: ears appear grossly normal no exudates. Neck: Supple. No thyromegaly. No LAD. Respiratory: clear to auscultation. Cardiovascular: Normal S1 and S2 without murmur or rub. Extremities: No cyanosis. pulses are equal. Neurologic: Alert and oriented. No involuntary movements.  LABS: Recent Results (from the past 2160 hour(s))  CBC     Status: Abnormal   Collection Time: 04/10/18 12:45 AM  Result Value Ref Range   WBC 8.9 3.8 - 10.6 K/uL   RBC 4.84 4.40 - 5.90 MIL/uL   Hemoglobin 16.4 13.0 - 18.0 g/dL    Comment: RESULT REPEATED AND VERIFIED   HCT 45.2 40.0 - 52.0 %    Comment: RESULT REPEATED AND VERIFIED   MCV 93.5 80.0 - 100.0 fL   MCH 33.8 26.0 - 34.0 pg   MCHC 36.2 (H) 32.0 - 36.0 g/dL   RDW 12.7 11.5 - 14.5 %   Platelets 200 150 - 440 K/uL    Comment: Performed at [facility], [address] [place],  [address]  Comprehensive metabolic panel     Status: Abnormal   Collection Time: 04/10/18 12:45 AM  Result Value Ref Range   Sodium 141 135 - 145 mmol/L   Potassium 3.2 (L) 3.5 - 5.1 mmol/L   Chloride 104 98 - 111 mmol/L   CO2 27 22 - 32 mmol/L   Glucose, Bld 113 (H) 70 - 99 mg/dL   BUN 18 6 - 20 mg/dL   Creatinine, Ser 1.04 0.61 - 1.24 mg/dL   Calcium 9.4 8.9 - 10.3 mg/dL   Total Protein 7.8 6.5 - 8.1 g/dL   Albumin 5.3 (H) 3.5 - 5.0 g/dL   AST 20 15 - 41 U/L   ALT 14 0 - 44 U/L   Alkaline Phosphatase 48 38 - 126 U/L   Total Bilirubin 1.1 0.3 - 1.2 mg/dL   GFR calc non Af Amer  >60 >60 mL/min   GFR calc Af Amer >60 >60 mL/min    Comment: (NOTE) The eGFR has been calculated using the CKD EPI equation. This calculation has not been validated in all clinical situations. eGFR's persistently <60 mL/min signify possible Chronic Kidney Disease.    Anion gap 10 5 - 15    Comment: Performed at [facility], [place], [place]  [address]  Troponin I     Status: None   Collection Time: 04/10/18 12:45 AM  Result Value Ref Range   Troponin I <0.03 <0.03 ng/mL    Comment: Performed at [facility], [address] [place], [place] [address]  Pulmonary function test     Status: None   Collection Time: 04/30/18  1:30 PM  Result Value Ref Range   FEV1     FVC     FEV1/FVC     TLC     DLCO      Radiology: Ct Angio Chest W/cm &/or Wo Cm  Result Date: 04/17/2018 CLINICAL DATA:  Chest pain and shortness of breath EXAM: CT ANGIOGRAPHY CHEST WITH CONTRAST TECHNIQUE: Multidetector CT imaging of the chest was performed using the standard protocol during bolus administration of intravenous contrast. Multiplanar CT image reconstructions and MIPs were obtained to evaluate the vascular anatomy. CONTRAST:  55m OMNIPAQUE IOHEXOL 350 MG/ML SOLN COMPARISON:  Chest radiograph April 10, 2018 FINDINGS: Cardiovascular: There is no demonstrable pulmonary embolus. There is no thoracic aortic aneurysm or dissection. The visualized great vessels appear unremarkable. There is no pericardial effusion or pericardial thickening. Mediastinum/Nodes: Thyroid appears normal. There is no appreciable thoracic adenopathy. No esophageal lesions are appreciable. Lungs/Pleura: Lungs are clear. There is no edema or consolidation. No pleural effusion or pleural thickening evident. Upper Abdomen: Visualized upper abdominal structures appear normal. Musculoskeletal: There are no blastic or lytic bone lesions. No chest wall lesions evident. Review of the MIP images confirms the above  findings. IMPRESSION: 1. No demonstrable pulmonary embolus. No thoracic aortic aneurysm or dissection. 2.  Lungs clear. 3.  No evident thoracic adenopathy. Electronically Signed   By: WLowella GripIII M.D.   On: 04/17/2018 13:05    No results found.  No results found.    Assessment and Plan: Patient Active Problem List   Diagnosis Date Noted  . SOB (shortness of breath)   . Cough    1. SOB (shortness of breath) Continue using albuterol inhaler as prescribed.  2. Cough Appears to be resolving.  Patient not complaining of excessive coughing at this visit.  Encouraged him to continue using his inhaler.  3. Chest pain, unspecified type This chest pain/pressure that he feels intermittently likely due to anxiety and stress.  He reports that it most often happens in class or when he is working on schoolwork.  I encouraged him to follow-up with his primary care physician to discuss the possibility of evaluation for anxiety.   General Counseling: I have discussed the findings of the evaluation and examination with MRodman Key  I have also discussed any further diagnostic evaluation thatmay be needed or ordered today. MLynnverbalizes understanding of the findings of todays visit. We also reviewed his medications today and discussed drug interactions and side effects including but not limited excessive drowsiness and altered mental states. We also discussed that there is always a risk not just to him but also people around him. he has been encouraged to call the office with any questions or concerns that should arise related to todays visit.    Time spent: 25 This patient was seen by AOrson GearAGNP-C in Collaboration with Dr. SDevona Konigas a part of collaborative care agreement.    I have personally obtained a history, examined the patient, evaluated laboratory and imaging results, formulated the assessment and plan and placed orders.    SAllyne Gee MD F[facility]enterPulmonary and  Critical Care Sleep medicine

## 2018-09-22 ENCOUNTER — Emergency Department
Admission: EM | Admit: 2018-09-22 | Discharge: 2018-09-22 | Disposition: A | Discharge status: Home / Self Care | Payer: Managed Care, Other (non HMO) | Attending: Emergency Medicine | Admitting: Emergency Medicine

## 2018-09-22 ENCOUNTER — Encounter: Payer: Self-pay | Admitting: Emergency Medicine

## 2018-09-22 ENCOUNTER — Other Ambulatory Visit: Payer: Self-pay

## 2018-09-22 ENCOUNTER — Emergency Department: Payer: Managed Care, Other (non HMO)

## 2018-09-22 DIAGNOSIS — K2900 Acute gastritis without bleeding: Secondary | ICD-10-CM

## 2018-09-22 DIAGNOSIS — Z87891 Personal history of nicotine dependence: Secondary | ICD-10-CM | POA: Diagnosis not present

## 2018-09-22 DIAGNOSIS — R1012 Left upper quadrant pain: Secondary | ICD-10-CM | POA: Diagnosis present

## 2018-09-22 LAB — URINALYSIS, COMPLETE (UACMP) WITH MICROSCOPIC
Bacteria, UA: NONE SEEN
Bilirubin Urine: NEGATIVE
GLUCOSE, UA: NEGATIVE mg/dL
Ketones, ur: 20 mg/dL — AB
Leukocytes,Ua: NEGATIVE
Nitrite: NEGATIVE
PH: 5 (ref 5.0–8.0)
Protein, ur: NEGATIVE mg/dL
SPECIFIC GRAVITY, URINE: 1.018 (ref 1.005–1.030)

## 2018-09-22 LAB — LIPASE, BLOOD: Lipase: 30 U/L (ref 11–51)

## 2018-09-22 LAB — COMPREHENSIVE METABOLIC PANEL
ALBUMIN: 5.4 g/dL — AB (ref 3.5–5.0)
ALK PHOS: 58 U/L (ref 38–126)
ALT: 14 U/L (ref 0–44)
ANION GAP: 12 (ref 5–15)
AST: 20 U/L (ref 15–41)
BILIRUBIN TOTAL: 1.9 mg/dL — AB (ref 0.3–1.2)
BUN: 19 mg/dL (ref 6–20)
CO2: 22 mmol/L (ref 22–32)
Calcium: 9.9 mg/dL (ref 8.9–10.3)
Chloride: 102 mmol/L (ref 98–111)
Creatinine, Ser: 1.05 mg/dL (ref 0.61–1.24)
GFR calc Af Amer: >60 mL/min (ref >60)
GFR calc non Af Amer: >60 mL/min (ref >60)
GLUCOSE: 118 mg/dL — AB (ref 70–99)
Potassium: 3.8 mmol/L (ref 3.5–5.1)
Sodium: 136 mmol/L (ref 135–145)
TOTAL PROTEIN: 8.2 g/dL — AB (ref 6.5–8.1)

## 2018-09-22 LAB — CBC
HCT: 50.2 % (ref 39.0–52.0)
Hemoglobin: 17.9 g/dL — ABNORMAL HIGH (ref 13.0–17.0)
MCH: 31.8 pg (ref 26.0–34.0)
MCHC: 35.7 g/dL (ref 30.0–36.0)
MCV: 89.2 fL (ref 80.0–100.0)
Platelets: 234 10*3/uL (ref 150–400)
RBC: 5.63 MIL/uL (ref 4.22–5.81)
RDW: 11.2 % — AB (ref 11.5–15.5)
WBC: 6 10*3/uL (ref 4.0–10.5)
nRBC: 0 % (ref 0.0–0.2)

## 2018-09-22 MED ORDER — IOHEXOL 300 MG/ML  SOLN
100.0000 mL | Freq: Once | INTRAMUSCULAR | Status: AC | PRN
  Administered 2018-09-22: 100 mL via INTRAVENOUS
  Filled 2018-09-22: qty 100

## 2018-09-22 MED ORDER — SUCRALFATE 1 G PO TABS: 1.0000 g | ORAL_TABLET | Freq: Four times a day (QID) | ORAL | 1 refills | Status: AC

## 2018-09-22 MED ORDER — SODIUM CHLORIDE 0.9% FLUSH
3.0000 mL | Freq: Once | INTRAVENOUS | Status: AC
  Administered 2018-09-22: 3 mL via INTRAVENOUS

## 2018-09-22 MED ORDER — IOPAMIDOL (ISOVUE-300) INJECTION 61%
30.0000 mL | Freq: Once | INTRAVENOUS | Status: AC | PRN
  Administered 2018-09-22: 30 mL via ORAL
  Filled 2018-09-22: qty 30

## 2018-09-22 NOTE — ED Provider Notes (Signed)
[facility] Emergency Department Provider Note   ____________________________________________    I have reviewed the triage vital signs and the nursing notes.   HISTORY  Chief Complaint Abdominal Pain     HPI Jeff Mejia is a 23 y.o. male who presents with complaints of abdominal pain.  Patient describes 3 days of waxing and waning left upper quadrant abdominal pain which she reports at times is severe.  Significant nausea, no vomiting.  No fevers or chills.  No recent upper respiratory function, diagnosed with mono in the fall.  No recent travel.  Has not take anything for this.  Has never had this before.  Past Medical History:  Diagnosis Date  . Cough   . Mononucleosis   . SOB (shortness of breath)   . SOB (shortness of breath)     Patient Active Problem List   Diagnosis Date Noted  . SOB (shortness of breath)   . Cough     Past Surgical History:  Procedure Laterality Date  . wisdom      Prior to Admission medications   Medication Sig Start Date End Date Taking? Authorizing Provider  Adapalene-Benzoyl Peroxide (EPIDUO) 0.1-2.5 % gel Apply topically at bedtime.    [provider]  albuterol (PROVENTIL HFA;VENTOLIN HFA) 108 (90 Base) MCG/ACT inhaler Inhale 2 puffs into the lungs every 6 (six) hours as needed for wheezing or shortness of breath. 2 puffs every 4 to 6 hrs  Prn 05/28/18   Johnna Acosta, NP  ibuprofen (ADVIL,MOTRIN) 600 MG tablet Take 600 mg by mouth every 6 (six) hours as needed.    [provider]  sucralfate (CARAFATE) 1 g tablet Take 1 tablet (1 g total) by mouth 4 (four) times daily for 20 days. 09/22/18 10/12/18  Jene Every, MD     Allergies Patient has no known allergies.  Family History  Problem Relation Age of Onset  . Skin cancer Mother   . Lung cancer Maternal Grandmother     Social History Social History   Tobacco Use  . Smoking status: Former Smoker    Types: E-cigarettes  .  Smokeless tobacco: Former Neurosurgeon    Types: Chew  Substance Use Topics  . Alcohol use: Yes  . Drug use: Yes    Types: Marijuana    Review of Systems  Constitutional: No fever/chills Eyes: No visual changes.  ENT: No sore throat. Cardiovascular: Denies chest pain. Respiratory: Denies shortness of breath. Gastrointestinal: As above Genitourinary: Negative for dysuria. Musculoskeletal: Negative for back pain. Skin: Negative for rash. Neurological: Negative for headaches or weakness   ____________________________________________   PHYSICAL EXAM:  VITAL SIGNS: ED Triage Vitals  Enc Vitals Group     BP 09/22/18 1105 (!) 136/55     Pulse Rate 09/22/18 1105 98     Resp 09/22/18 1105 16     Temp 09/22/18 1105 98.5 F (36.9 C)     Temp Source 09/22/18 1105 Oral     SpO2 09/22/18 1105 98 %     Weight 09/22/18 1104 63.5 kg (140 lb)     Height 09/22/18 1104 1.803 m (5\' 11" )     Head Circumference --      Peak Flow --      Pain Score 09/22/18 1104 6     Pain Loc --      Pain Edu? --      Excl. in GC? --     Constitutional: Alert and oriented. No acute distress.  Pleasant and interactive  Mouth/Throat: Mucous membranes are moist.   Neck:  Painless ROM Cardiovascular: Normal rate, regular rhythm. Grossly normal heart sounds.  Good peripheral circulation. Respiratory: Normal respiratory effort.  No retractions. Lungs CTAB. Gastrointestinal: Soft, no distention, no CVA tenderness, mild tenderness palpation left upper quadrant  Musculoskeletal: .  Warm and well perfused Neurologic:  Normal speech and language. No gross focal neurologic deficits are appreciated.  Skin:  Skin is warm, dry and intact. No rash noted. Psychiatric: Mood and affect are normal. Speech and behavior are normal.  ____________________________________________   LABS (all labs ordered are listed, but only abnormal results are displayed)  Labs Reviewed  COMPREHENSIVE METABOLIC PANEL - Abnormal; Notable  for the following components:      Result Value   Glucose, Bld 118 (*)    Total Protein 8.2 (*)    Albumin 5.4 (*)    Total Bilirubin 1.9 (*)    All other components within normal limits  CBC - Abnormal; Notable for the following components:   Hemoglobin 17.9 (*)    RDW 11.2 (*)    All other components within normal limits  URINALYSIS, COMPLETE (UACMP) WITH MICROSCOPIC - Abnormal; Notable for the following components:   Color, Urine YELLOW (*)    APPearance CLEAR (*)    Hgb urine dipstick SMALL (*)    Ketones, ur 20 (*)    All other components within normal limits  LIPASE, BLOOD   ____________________________________________  EKG  None ____________________________________________  RADIOLOGY  CT abdomen pelvis ____________________________________________   PROCEDURES  Procedure(s) performed: No  Procedures   Critical Care performed: No ____________________________________________   INITIAL IMPRESSION / ASSESSMENT AND PLAN / ED COURSE  Pertinent labs & imaging results that were available during my care of the patient were reviewed by me and considered in my medical decision making (see chart for details).  Patient presents with left upper quadrant pain, differential includes gastritis, PUD, splenic enlargement possibly related to mono in the fall versus nonspecific abdominal pain  Lab work is overall quite reassuring, lipase is normal.  We will obtain imaging to evaluate further  ----------------------------------------- 2:06 PM on 09/22/2018 -----------------------------------------  CT abdomen pelvis is unremarkable, suspect gastritis, will start the patient on Carafate he is Artie taking twice daily Prilosec, outpatient follow-up with GI for further evaluation   ____________________________________________   FINAL CLINICAL IMPRESSION(S) / ED DIAGNOSES  Final diagnoses:  Acute gastritis without hemorrhage, unspecified gastritis type        Note:   This document was prepared using Dragon voice recognition software and may include unintentional dictation errors.   Jene Every, MD 09/22/18 [phone]

## 2018-09-22 NOTE — ED Triage Notes (Signed)
Pt to ED via POV c/o abdominal pain since Friday. Pt states that he was seen at urgent care on Saturday but left before being seen. Pt states that the pain is located under his left rib cage. Pt is in NAD at this time.

## 2018-09-30 ENCOUNTER — Other Ambulatory Visit: Payer: Self-pay

## 2018-09-30 ENCOUNTER — Encounter: Payer: Self-pay | Admitting: Gastroenterology

## 2018-09-30 ENCOUNTER — Ambulatory Visit: Payer: Managed Care, Other (non HMO) | Admitting: Gastroenterology

## 2018-09-30 VITALS — BP 145/75 | HR 91 | Ht 71.0 in | Wt 134.8 lb

## 2018-09-30 DIAGNOSIS — R1013 Epigastric pain: Secondary | ICD-10-CM

## 2018-09-30 DIAGNOSIS — R197 Diarrhea, unspecified: Secondary | ICD-10-CM

## 2018-09-30 DIAGNOSIS — R748 Abnormal levels of other serum enzymes: Secondary | ICD-10-CM

## 2018-09-30 NOTE — Addendum Note (Signed)
Addended by: Jackquline Denmark on: 09/30/2018 05:03 PM   Modules accepted: Orders, SmartSet

## 2018-09-30 NOTE — Progress Notes (Signed)
Jeff Mejia [address]  [address]  [place], [place] [address]  Main: [phone]  Fax: [phone]   Gastroenterology Consultation  Referring Provider:     Daiva Eves, MD Primary Care Physician:  Daiva Eves, MD Reason for Consultation:    Abdominal discomfort        HPI:    Chief Complaint  Patient presents with  . New Patient (Initial Visit)    ED f/u acute gastritis    TORIS MILILLO is a 23 y.o. y/o male referred for consultation & management  by Dr. Daiva Eves, MD.  Patient presents with his mother for further evaluation of abdominal complaints.  Patient lives in [place] for school, and is here visiting his family.  Mother states she has noticed that the patient has been eating less, reports early satiety, and abdominal bloating immediately after eating, this has been ongoing for the last 3 to 6 months.  They went to the ER on March 9 due to abdominal pain in the left upper quadrant.  Work-up was reassuring and they were discharged with Protonix and sucralfate.  Patient states the sucralfate has been helping.  Prior to the Protonix he was on Prilosec for about 3 months which helped as well but symptoms continued on a daily basis.  No nausea or vomiting, no weight loss.  Abdominal pain is more of a discomfort than pain itself.  Denies any dysphagia, but states intermittently he feels that food goes down very slowly through his esophagus.  No episodes of food impaction.  Drinks about twice a week and states during those days he may have 3-4 drinks in 1 sitting.  Describes marijuana use but no tobacco use.  No episodes of vomiting.  Past Medical History:  Diagnosis Date  . Cough   . Mononucleosis   . SOB (shortness of breath)   . SOB (shortness of breath)     Past Surgical History:  Procedure Laterality Date  . wisdom      Prior to Admission medications   Medication Sig Start Date End Date Taking? Authorizing Provider   Adapalene-Benzoyl Peroxide (EPIDUO) 0.1-2.5 % gel Apply topically at bedtime.   Yes [provider]  albuterol (PROVENTIL HFA;VENTOLIN HFA) 108 (90 Base) MCG/ACT inhaler Inhale 2 puffs into the lungs every 6 (six) hours as needed for wheezing or shortness of breath. 2 puffs every 4 to 6 hrs  Prn 05/28/18  Yes [place], Coralee North, NP  pantoprazole (PROTONIX) 40 MG tablet  09/20/18  Yes [provider]  sucralfate (CARAFATE) 1 g tablet Take 1 tablet (1 g total) by mouth 4 (four) times daily for 20 days. 09/22/18 10/12/18 Yes Jene Every, MD  ibuprofen (ADVIL,MOTRIN) 600 MG tablet Take 600 mg by mouth every 6 (six) hours as needed.    [provider]    Family History  Problem Relation Age of Onset  . Skin cancer Mother   . Lung cancer Maternal Grandmother      Social History   Tobacco Use  . Smoking status: Former Smoker    Types: E-cigarettes  . Smokeless tobacco: Former Neurosurgeon    Types: Chew  Substance Use Topics  . Alcohol use: Yes  . Drug use: Yes    Types: Marijuana    Allergies as of 09/30/2018  . (No Known Allergies)    Review of Systems:    All systems reviewed and negative except where noted in HPI.   Physical Exam:  BP Marland Kitchen)  145/75   Pulse 91   Ht 5\' 11"  (1.803 m)   Wt 134 lb 12.8 oz (61.1 kg)   BMI 18.80 kg/m  No LMP for male patient. Psych:  Alert and cooperative. Normal mood and affect. General:   Alert,  Well-developed, well-nourished, pleasant and cooperative in NAD Head:  Normocephalic and atraumatic. Eyes:  Sclera clear, no icterus.   Conjunctiva pink. Ears:  Normal auditory acuity. Nose:  No deformity, discharge, or lesions. Mouth:  No deformity or lesions,oropharynx pink & moist. Neck:  Supple; no masses or thyromegaly. Abdomen:  Normal bowel sounds.  No bruits.  Soft, non-tender and non-distended without masses, hepatosplenomegaly or hernias noted.  No guarding or rebound tenderness.    Msk:  Symmetrical without gross deformities.  Good, equal movement & strength bilaterally. Pulses:  Normal pulses noted. Extremities:  No clubbing or edema.  No cyanosis. Neurologic:  Alert and oriented x3;  grossly normal neurologically. Skin:  Intact without significant lesions or rashes. No jaundice. Lymph Nodes:  No significant cervical adenopathy. Psych:  Alert and cooperative. Normal mood and affect.   Labs: CBC    Component Value Date/Time   WBC 6.0 09/22/2018 1110   RBC 5.63 09/22/2018 1110   HGB 17.9 (H) 09/22/2018 1110   HCT 50.2 09/22/2018 1110   PLT 234 09/22/2018 1110   MCV 89.2 09/22/2018 1110   MCH 31.8 09/22/2018 1110   MCHC 35.7 09/22/2018 1110   RDW 11.2 (L) 09/22/2018 1110   CMP     Component Value Date/Time   NA 136 09/22/2018 1110   K 3.8 09/22/2018 1110   CL 102 09/22/2018 1110   CO2 22 09/22/2018 1110   GLUCOSE 118 (H) 09/22/2018 1110   BUN 19 09/22/2018 1110   CREATININE 1.05 09/22/2018 1110   CALCIUM 9.9 09/22/2018 1110   PROT 8.2 (H) 09/22/2018 1110   ALBUMIN 5.4 (H) 09/22/2018 1110   AST 20 09/22/2018 1110   ALT 14 09/22/2018 1110   ALKPHOS 58 09/22/2018 1110   BILITOT 1.9 (H) 09/22/2018 1110   GFRNONAA >60 09/22/2018 1110   GFRAA >60 09/22/2018 1110    Imaging Studies: Ct Abdomen Pelvis W Contrast  Result Date: 09/22/2018 CLINICAL DATA:  Left upper quadrant pain for 3 days. EXAM: CT ABDOMEN AND PELVIS WITH CONTRAST TECHNIQUE: Multidetector CT imaging of the abdomen and pelvis was performed using the standard protocol following bolus administration of intravenous contrast. CONTRAST:  OMNIPAQUE IOHEXOL 300 MG/ML  SOLN COMPARISON:  None. FINDINGS: Lower chest: No acute abnormality. Hepatobiliary: No focal liver abnormality is seen. No gallstones, gallbladder wall thickening, or biliary dilatation. Pancreas: Unremarkable. No pancreatic ductal dilatation or surrounding inflammatory changes. Spleen: Normal in size without focal abnormality. Adrenals/Urinary Tract: Adrenal glands are  unremarkable. Kidneys are normal, without renal calculi, focal lesion, or hydronephrosis. Bladder is unremarkable. Stomach/Bowel: Stomach is within normal limits. Appendix appears normal. No evidence of bowel wall thickening, distention, or inflammatory changes. Vascular/Lymphatic: No significant vascular findings are present. No enlarged abdominal or pelvic lymph nodes. Reproductive: Prostate is unremarkable. Other: No abdominal wall hernia or abnormality. No abdominopelvic ascites. Musculoskeletal: No acute or significant osseous findings. IMPRESSION: 1. No cause for the patient's symptoms identified. No acute abnormalities. Electronically Signed   By: Gerome Sam III M.D   On: 09/22/2018 13:46    Assessment and Plan:   D'ARCY LEWIN is a 23 y.o. y/o male has been referred for abdominal pain   Symptoms most likely related to dyspepsia or GERD However,  patient and mother specifically state that he has been diagnosed with anxiety and attribute some of his symptoms to anxiety.  His primary care physician has recommended anxiety medications but they have not started it so far as they want to wait for GI evaluation.  Due to the change in his eating habits they are concerned He has not been using any NSAIDs recently but about 1 to 2 months ago was using ibuprofen or Aleve as needed  He denies any black stool, but has noted that he has 1-2 bowel movements a day and they are different in texture.  Denies any blood per rectum.  Due to ongoing symptoms despite PPI therapy, we discussed conservative management with H. pylori testing versus proceeding with upper endoscopy.  Patient reports previous testing for H. pylori via blood test.  I do not have these results but patient states it was negative and was done at the time he was already taking Prilosec.  They would like to proceed with upper endoscopy at this time for further evaluation given ongoing symptoms  He has no alarm symptoms or family  history to indicate colonoscopy at this time However, will order stool studies at this time as well  We discussed that if EGD is normal, given his anxiety symptoms may be functional in nature and he should follow-up with his primary care provider to discuss anxiety medications further that were discussed with him previously by his PCP.  I have discussed alternative options, risks & benefits,  which include, but are not limited to, bleeding, infection, perforation,respiratory complication & drug reaction.  The patient agrees with this plan & written consent will be obtained.    I will also repeat his liver enzymes as his bilirubin was mildly elevated during his ER visit.  If this is still elevated he will need further work-up.  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

## 2018-10-01 ENCOUNTER — Other Ambulatory Visit: Payer: Self-pay

## 2018-10-01 ENCOUNTER — Telehealth: Payer: Self-pay | Admitting: Gastroenterology

## 2018-10-01 DIAGNOSIS — R1013 Epigastric pain: Secondary | ICD-10-CM

## 2018-10-01 LAB — HEPATIC FUNCTION PANEL
ALBUMIN: 5 g/dL (ref 4.1–5.2)
ALT: 11 IU/L (ref 0–44)
AST: 16 IU/L (ref 0–40)
Alkaline Phosphatase: 64 IU/L (ref 39–117)
BILIRUBIN TOTAL: 0.7 mg/dL (ref 0.0–1.2)
Bilirubin, Direct: 0.21 mg/dL (ref 0.00–0.40)
TOTAL PROTEIN: 7.5 g/dL (ref 6.0–8.5)

## 2018-10-01 NOTE — Telephone Encounter (Signed)
Patient called & L/M ON V/M  He was seen by Dr Maximino Greenland 09-30-18 in the [facility] office. He had some lab work drawn & other specimens. He would like to know the names for his records.

## 2018-10-01 NOTE — Telephone Encounter (Signed)
This patient was called today to discuss cancellation of his procedure as the instructions we have received in the setting of coronavirus situation, is to reschedule any elective procedures at this time.  Since his work-up has been very reassuring so far including imaging and lab work, his scheduled EGD is considered an elective procedure.  I discussed with the patient his reassuring findings so far, and have recommended he continue his PPI.  We will also obtain H. pylori serology at this time.  I recommended an upper GI study but that is not available to be scheduled until April 20.  I recommended to the patient that he get this scheduled at this time and if scheduling goes back to normal before then and we are able to schedule his EGD sooner with, we can go ahead and schedule that.  Given that he admits to anxiety and depression states that a lot of her symptoms are related to that and his primary care provider discussed anxiety medication with him, I have asked him to follow-up with his PCP as well and discuss starting on those medications as well.

## 2018-10-02 ENCOUNTER — Other Ambulatory Visit: Payer: Self-pay

## 2018-10-02 DIAGNOSIS — R1013 Epigastric pain: Secondary | ICD-10-CM

## 2018-10-02 MED ORDER — PANTOPRAZOLE SODIUM 40 MG PO TBEC: 40.0000 mg | DELAYED_RELEASE_TABLET | Freq: Two times a day (BID) | ORAL | 1 refills | Status: AC

## 2018-10-03 ENCOUNTER — Encounter: Admission: RE | Payer: Self-pay | Source: Home / Self Care

## 2018-10-03 ENCOUNTER — Ambulatory Visit
Admission: RE | Admit: 2018-10-03 | Payer: Managed Care, Other (non HMO) | Source: Home / Self Care | Admitting: Gastroenterology

## 2018-10-03 SURGERY — ESOPHAGOGASTRODUODENOSCOPY (EGD) WITH PROPOFOL
Anesthesia: General

## 2018-10-04 LAB — H PYLORI, IGM, IGG, IGA AB
H pylori, IgM Abs: <9 units (ref 0.0–8.9)
H. pylori, IgA Abs: <9 units (ref 0.0–8.9)
H. pylori, IgG AbS: 0.09 Index Value (ref 0.00–0.79)

## 2018-10-06 ENCOUNTER — Telehealth: Payer: Self-pay

## 2018-10-06 LAB — GI PROFILE, STOOL, PCR
ADENOVIRUS F 40/41: NOT DETECTED
ASTROVIRUS: NOT DETECTED
C difficile toxin A/B: NOT DETECTED
CYCLOSPORA CAYETANENSIS: NOT DETECTED
Campylobacter: NOT DETECTED
Cryptosporidium: NOT DETECTED
ENTAMOEBA HISTOLYTICA: NOT DETECTED
Enteroaggregative E coli: NOT DETECTED
Enteropathogenic E coli: NOT DETECTED
Enterotoxigenic E coli: NOT DETECTED
Giardia lamblia: NOT DETECTED
Norovirus GI/GII: NOT DETECTED
Plesiomonas shigelloides: NOT DETECTED
Rotavirus A: NOT DETECTED
Salmonella: NOT DETECTED
Sapovirus: NOT DETECTED
Shiga-toxin-producing E coli: NOT DETECTED
Shigella/Enteroinvasive E coli: NOT DETECTED
VIBRIO CHOLERAE: NOT DETECTED
VIBRIO: NOT DETECTED
Yersinia enterocolitica: NOT DETECTED

## 2018-10-06 LAB — CALPROTECTIN, FECAL: CALPROTECTIN, FECAL: 87 ug/g (ref 0–120)

## 2018-10-06 LAB — FECAL LACTOFERRIN, QUANT: LACTOFERRIN, FECAL, QUANT.: 3.84 ug/mL (ref 0.00–7.24)

## 2018-10-06 NOTE — Telephone Encounter (Signed)
Jeff Mejia please let patient know, his blood work and stool tests all came back normal. No infection on stool testing. Stool inflammatory marker was normal as well. H pylori test was normal as well. He should take his Protonix as prescribed. Follow up with his PCP. Please set recall for EGD for 3 months so we can schedule his elective EGD when schedule opens up.   Pt notified of results. He will be graduating from [employer] and will be moving back to [place]. I was explaining that we could send him a recall letter and if he comes to [place], [place] visiting family, let us know or if he chooses to get provider in [place] we are happy to send records and the doctor recommendations. Will send recall letter to address: [address] [place], [place]  [address]

## 2018-11-03 ENCOUNTER — Ambulatory Visit: Admission: RE | Admit: 2018-11-03 | Payer: Managed Care, Other (non HMO) | Source: Ambulatory Visit

## 2020-09-18 IMAGING — CR DG CHEST 2V
2 series · 2 of 2 positions shown · non-contrast
Comparison: 04/07/2018 chest radiograph.

CLINICAL DATA: Chest pain

EXAM:
CHEST - 2 VIEW

[chest pa]
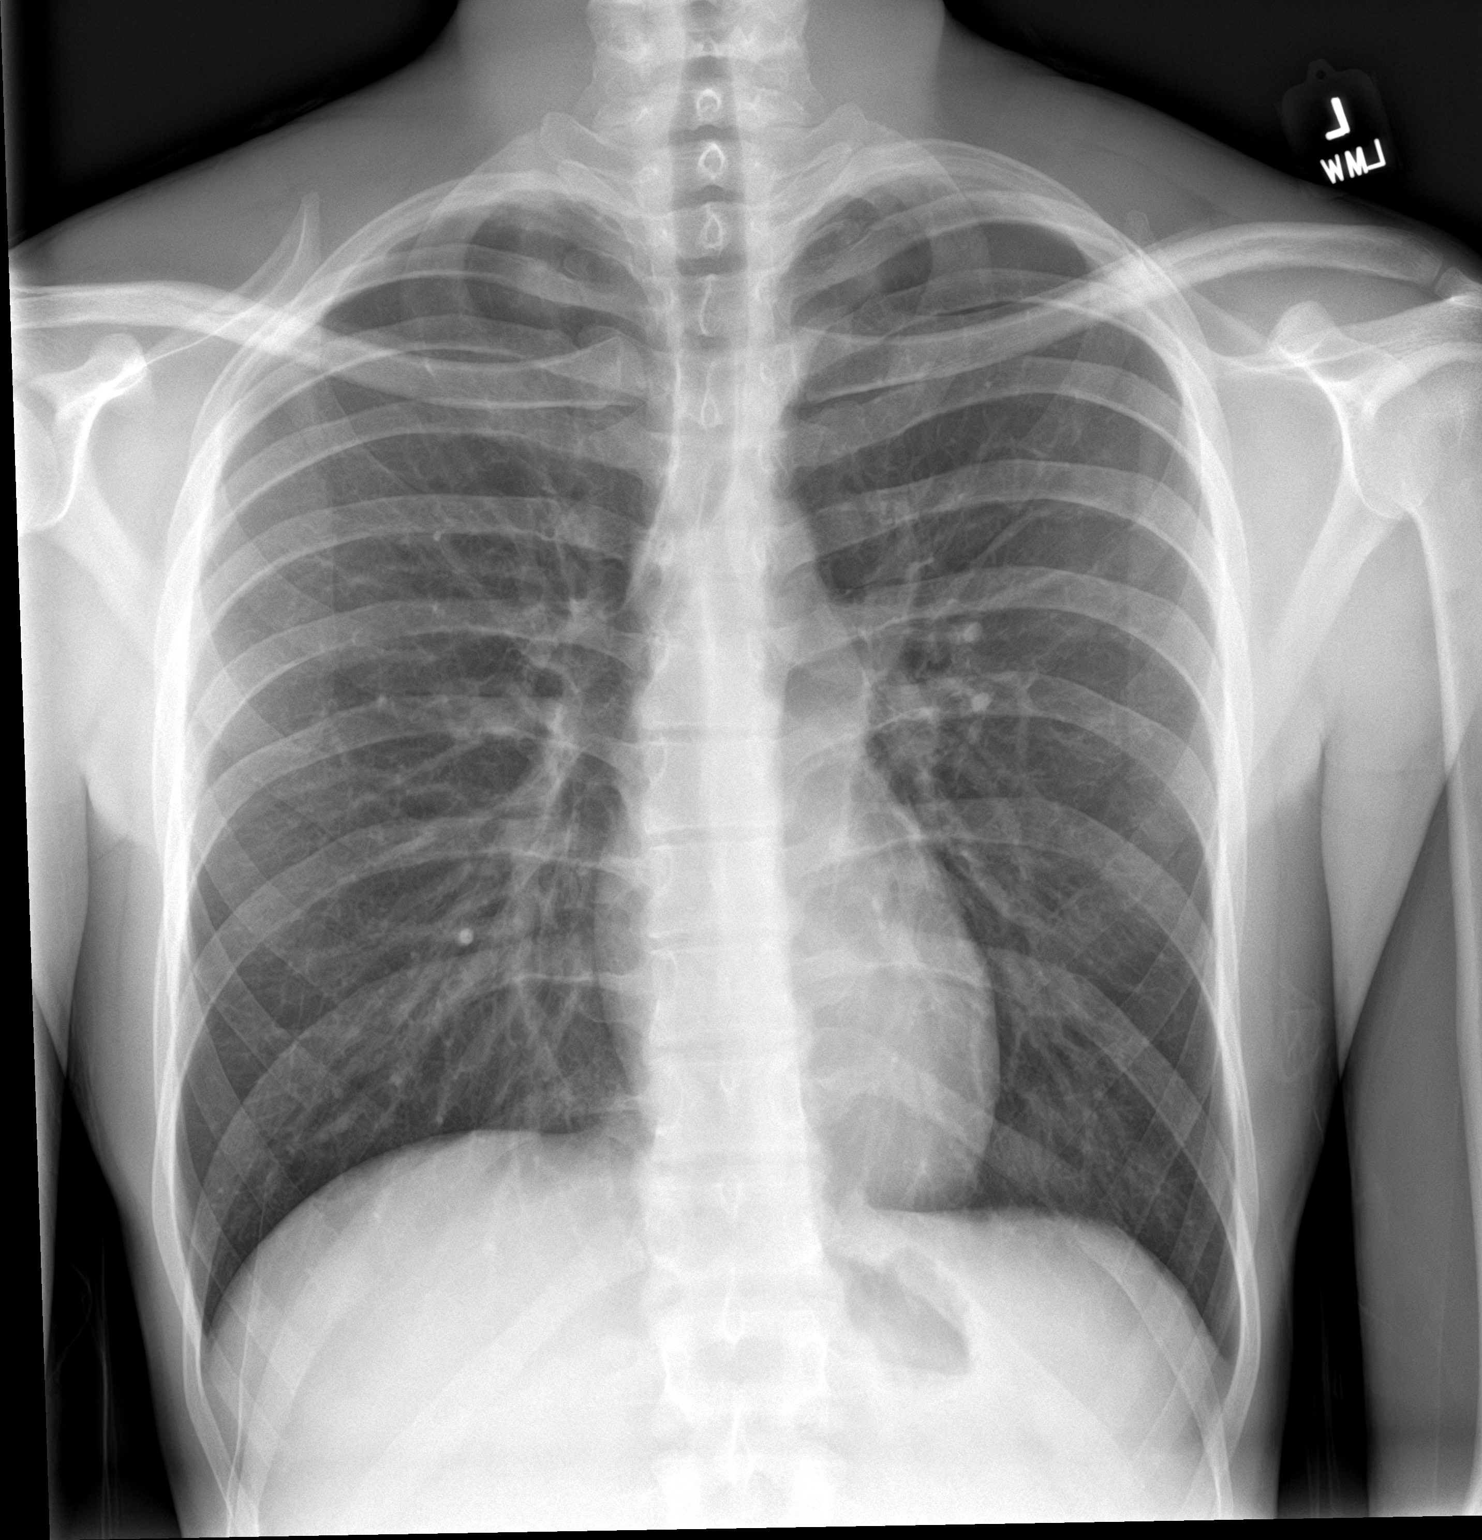

[chest lat]
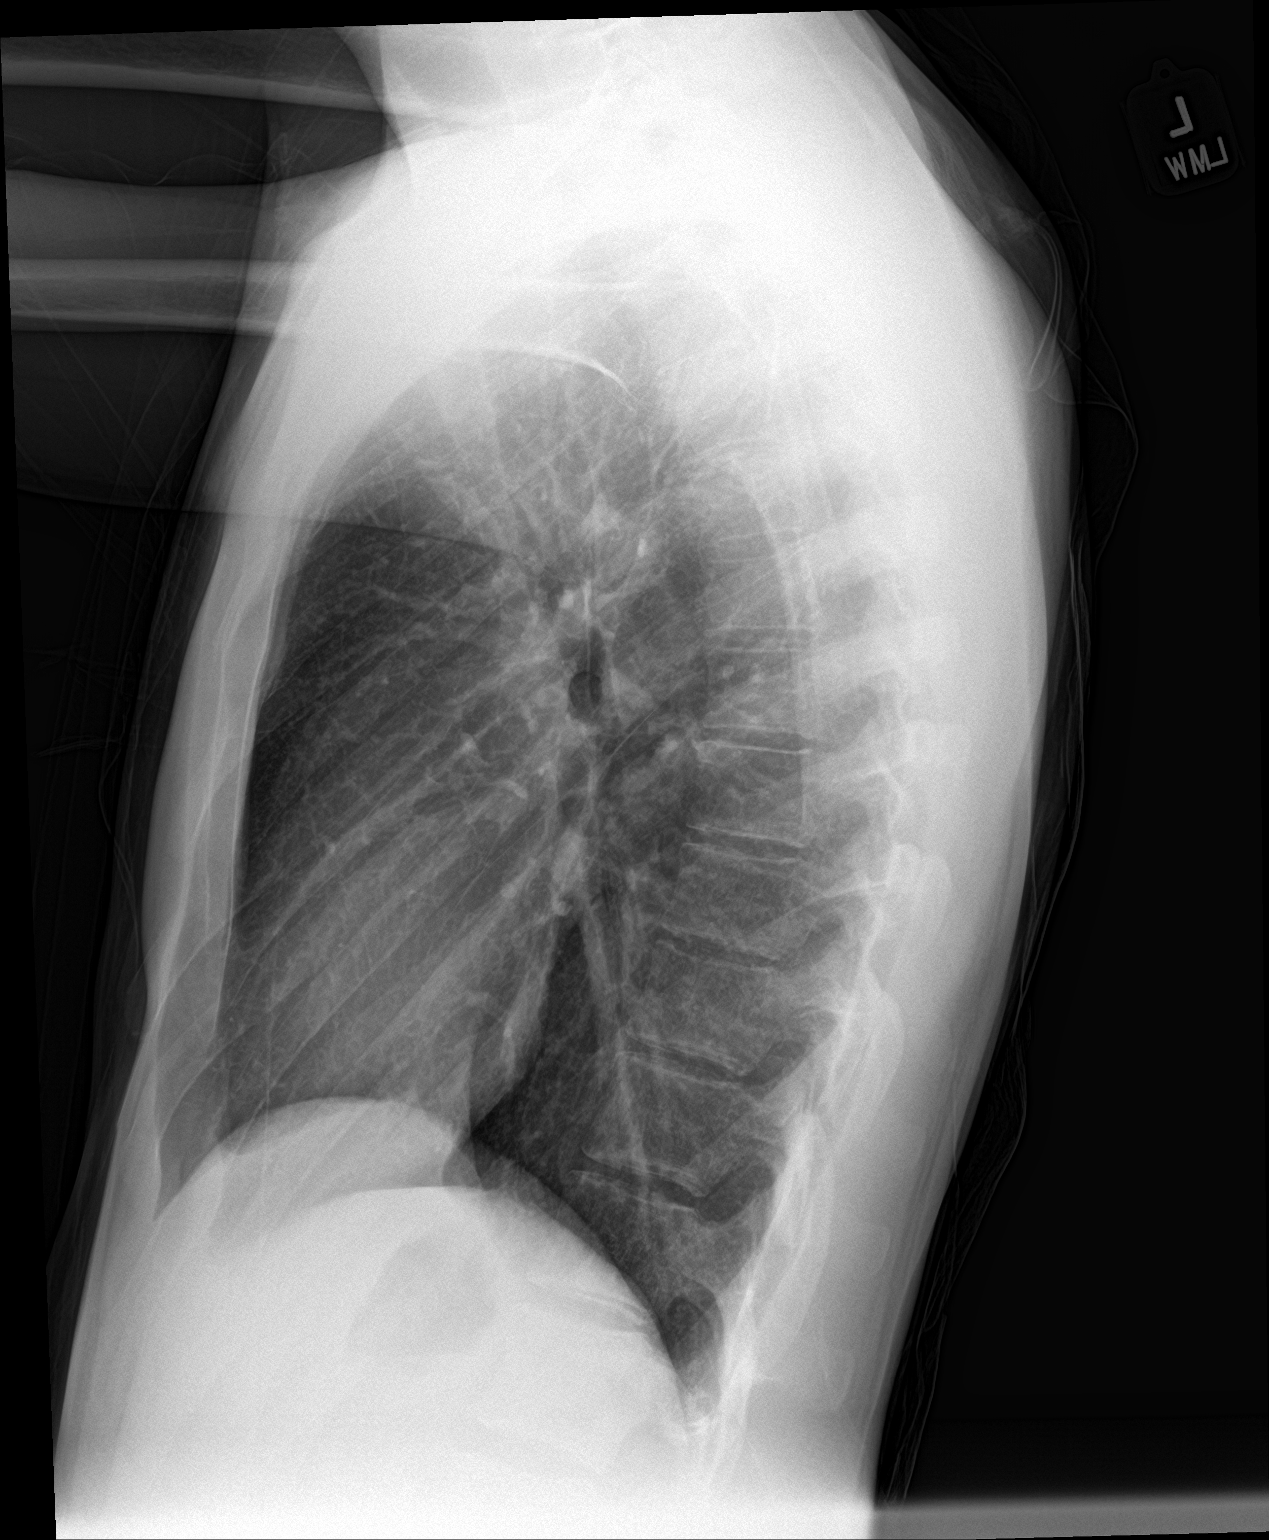

[2 of 2 positions shown; findings below may reference images not displayed]

FINDINGS: Stable cardiomediastinal silhouette with normal heart size. No
pneumothorax. No pleural effusion. Lungs appear clear, with no acute
consolidative airspace disease and no pulmonary edema.
IMPRESSION: No active cardiopulmonary disease.
